# Patient Record
Sex: Male | Born: 1975 | Race: White | Hispanic: No | Smoking: Never smoker
Health system: Southern US, Community
[De-identification: ages and names within clinical notes are randomized; demographics above are authoritative.]

---

## 2020-09-29 ENCOUNTER — Emergency Department (HOSPITAL_BASED_OUTPATIENT_CLINIC_OR_DEPARTMENT_OTHER)
Admission: EM | Admit: 2020-09-29 | Discharge: 2020-09-29 | Disposition: A | Discharge status: Home / Self Care | Payer: No Typology Code available for payment source | Attending: Emergency Medicine | Admitting: Emergency Medicine

## 2020-09-29 ENCOUNTER — Other Ambulatory Visit: Payer: Self-pay

## 2020-09-29 ENCOUNTER — Emergency Department (HOSPITAL_BASED_OUTPATIENT_CLINIC_OR_DEPARTMENT_OTHER): Payer: No Typology Code available for payment source

## 2020-09-29 ENCOUNTER — Encounter (HOSPITAL_BASED_OUTPATIENT_CLINIC_OR_DEPARTMENT_OTHER): Payer: Self-pay | Admitting: Emergency Medicine

## 2020-09-29 DIAGNOSIS — H8110 Benign paroxysmal vertigo, unspecified ear: Secondary | ICD-10-CM

## 2020-09-29 DIAGNOSIS — R221 Localized swelling, mass and lump, neck: Secondary | ICD-10-CM | POA: Insufficient documentation

## 2020-09-29 DIAGNOSIS — R609 Edema, unspecified: Secondary | ICD-10-CM

## 2020-09-29 DIAGNOSIS — R599 Enlarged lymph nodes, unspecified: Secondary | ICD-10-CM

## 2020-09-29 LAB — BASIC METABOLIC PANEL
Anion gap: 8 (ref 5–15)
BUN: 11 mg/dL (ref 6–20)
CO2: 28 mmol/L (ref 22–32)
Calcium: 9.5 mg/dL (ref 8.9–10.3)
Chloride: 103 mmol/L (ref 98–111)
Creatinine, Ser: 0.75 mg/dL (ref 0.61–1.24)
GFR, Estimated: >60 mL/min (ref >60)
Glucose, Bld: 108 mg/dL — ABNORMAL HIGH (ref 70–99)
Potassium: 3.9 mmol/L (ref 3.5–5.1)
Sodium: 139 mmol/L (ref 135–145)

## 2020-09-29 LAB — CBC WITH DIFFERENTIAL/PLATELET
Abs Immature Granulocytes: 0.01 10*3/uL (ref 0.00–0.07)
Basophils Absolute: 0 10*3/uL (ref 0.0–0.1)
Basophils Relative: 1 %
Eosinophils Absolute: 0 10*3/uL (ref 0.0–0.5)
Eosinophils Relative: 1 %
HCT: 44.2 % (ref 39.0–52.0)
Hemoglobin: 15 g/dL (ref 13.0–17.0)
Immature Granulocytes: 0 %
Lymphocytes Relative: 38 %
Lymphs Abs: 1.9 10*3/uL (ref 0.7–4.0)
MCH: 29.9 pg (ref 26.0–34.0)
MCHC: 33.9 g/dL (ref 30.0–36.0)
MCV: 88.2 fL (ref 80.0–100.0)
Monocytes Absolute: 0.4 10*3/uL (ref 0.1–1.0)
Monocytes Relative: 9 %
Neutro Abs: 2.6 10*3/uL (ref 1.7–7.7)
Neutrophils Relative %: 51 %
Platelets: 214 10*3/uL (ref 150–400)
RBC: 5.01 MIL/uL (ref 4.22–5.81)
RDW: 13.4 % (ref 11.5–15.5)
WBC: 5 10*3/uL (ref 4.0–10.5)
nRBC: 0 % (ref 0.0–0.2)

## 2020-09-29 MED ORDER — MECLIZINE HCL 25 MG PO TABS: 25.0000 mg | ORAL_TABLET | Freq: Three times a day (TID) | ORAL | 0 refills | Status: AC | PRN

## 2020-09-29 NOTE — ED Provider Notes (Signed)
[facility] EMERGENCY DEPT Provider Note   CSN: [id number] Arrival date & time: 09/29/20  1121     History Chief Complaint  Patient presents with  . Dizziness    Daniel Horton is a 45 y.o. male.  The history is provided by the patient and the spouse.  Dizziness  Daniel Horton is a 45 y.o. male who presents to the Emergency Department complaining of dizziness. He presents for evaluation of dizziness/vertigo that started on Saturday morning while sleeping.  He wound turn his head or turn over in bed and he would experience episodes of vertigo. These would self extinguish. Since that time he has experienced persistent symptoms and at times they happened while he is awake and walking two. At times he has an unsteady gait. He also noticed a lump on the right side of his neck near his carotid about six weeks ago. He did see his PCP for this and they are watching it. He reports no change in size or swelling. No additional symptoms no headache, chest pain, abdominal pain, nausea, vomiting, diarrhea. He has no known medical problems and takes no medications.    History reviewed. No pertinent past medical history.  There are no problems to display for this patient.   History reviewed. No pertinent surgical history.     No family history on file.  Social History   Tobacco Use  . Smoking status: Never Smoker  Vaping Use  . Vaping Use: Never used  Substance Use Topics  . Alcohol use: Yes  . Drug use: Not Currently    Home Medications Prior to Admission medications   Medication Sig Start Date End Date Taking? Authorizing Provider  meclizine (ANTIVERT) 25 MG tablet Take 1 tablet (25 mg total) by mouth 3 (three) times daily as needed for dizziness. 09/29/20  Yes Tilden Fossa, MD    Allergies    Patient has no known allergies.  Review of Systems   Review of Systems  Neurological: Positive for dizziness.  All other systems reviewed and are negative.   Physical  Exam Updated Vital Signs BP 122/87 (BP Location: Right Arm)   Pulse 63   Temp 98 F (36.7 C) (Oral)   Resp 18   Ht 5\' 10"  (1.778 m)   Wt 79.8 kg   SpO2 99%   BMI 25.25 kg/m   Physical Exam Vitals and nursing note reviewed.  Constitutional:      Appearance: He is well-developed.  HENT:     Head: Normocephalic and atraumatic.     Right Ear: Tympanic membrane normal.     Left Ear: Tympanic membrane normal.  Neck:     Comments: There is a mobile swelling to the right superior lateral neck over the carotid that is nontender to palpation. No overlying erythema. Cardiovascular:     Rate and Rhythm: Normal rate and regular rhythm.     Heart sounds: No murmur heard.   Pulmonary:     Effort: Pulmonary effort is normal. No respiratory distress.     Breath sounds: Normal breath sounds.  Abdominal:     Palpations: Abdomen is soft.     Tenderness: There is no abdominal tenderness. There is no guarding or rebound.  Musculoskeletal:        General: No tenderness.     Cervical back: Neck supple.  Skin:    General: Skin is warm and dry.  Neurological:     Mental Status: He is alert and oriented to person, place, and time.  Comments: No asymmetry of facial movements. Pupils equal round and reactive. Five out of five strength in all four extremities with sensation light touch intact in all four extremities. No ataxia on finger to nose bilaterally.  Psychiatric:        Behavior: Behavior normal.     ED Results / Procedures / Treatments   Labs (all labs ordered are listed, but only abnormal results are displayed) Labs Reviewed  BASIC METABOLIC PANEL - Abnormal; Notable for the following components:      Result Value   Glucose, Bld 108 (*)    All other components within normal limits  CBC WITH DIFFERENTIAL/PLATELET    EKG EKG Interpretation  Date/Time:  Monday Sep 29 2020 15:26:17 EDT Ventricular Rate:  73 PR Interval:  151 QRS Duration: 96 QT Interval:  392 QTC  Calculation: 432 R Axis:   59 Text Interpretation: Sinus rhythm Left atrial enlargement Confirmed by Tilden Fossa [phone]) on 09/29/2020 3:29:46 PM   Radiology CT Head Wo Contrast  Result Date: 09/29/2020 CLINICAL DATA:  Dizziness and loss of balance for 3 days. EXAM: CT HEAD WITHOUT CONTRAST TECHNIQUE: Contiguous axial images were obtained from the base of the skull through the vertex without intravenous contrast. COMPARISON:  None. FINDINGS: Brain: The ventricles are normal in size and configuration. No extra-axial fluid collections are identified. The gray-white differentiation is maintained. No CT findings for acute hemispheric infarction or intracranial hemorrhage. No mass lesions. The brainstem and cerebellum are normal. Vascular: No hyperdense vessels or obvious aneurysm. Skull: No acute skull fracture.  No bone lesion. Sinuses/Orbits: The paranasal sinuses and mastoid air cells are clear except for a small amount of fluid in the left half the sphenoid sinus. The globes are intact. Other: No scalp lesions, laceration or hematoma. IMPRESSION: 1. No acute intracranial findings or mass lesions. 2. Small amount of fluid in the left half of the sphenoid sinus. Electronically Signed   By: Rudie Meyer M.D.   On: 09/29/2020 16:50   US SOFT TISSUE HEAD & NECK (NON-THYROID)  Result Date: 09/29/2020 CLINICAL DATA:  Palpable mass in the right neck EXAM: ULTRASOUND OF HEAD/NECK SOFT TISSUES TECHNIQUE: Ultrasound examination of the head and neck soft tissues was performed in the area of clinical concern. COMPARISON:  None. FINDINGS: Targeted sonography of the right neck is performed in the region of palpable concern. There is a benign appearing lymph node at the carotid bulb measuring 2.4 x 0.6 by 1.7 cm, there is preserved fatty hilus. Limited ultrasound of the contralateral left neck demonstrates a similar appearing benign lymph node measuring 2.4 x 0.6 x 1.2 cm. No cystic or solid mass is otherwise seen  IMPRESSION: Benign-appearing lymph node in the right neck with similar appearing lymph node in the contralateral left neck. Otherwise no cysts or solid masses are seen in the right neck. Electronically Signed   By: Jasmine Pang M.D.   On: 09/29/2020 17:40    Procedures Procedures   Medications Ordered in ED Medications - No data to display  ED Course  I have reviewed the triage vital signs and the nursing notes.  Pertinent labs & imaging results that were available during my care of the patient were reviewed by me and considered in my medical decision making (see chart for details).    MDM Rules/Calculators/A&P                         patient here for evaluation of intermittent dizziness  since Saturday. He also has increased swelling to the right lateral neck. On evaluation the department he is neurologically intact but symptoms do wax and wane. CT head is negative for acute abnormality. Ultrasound of the neck does demonstrate a lymph node that is benign appearing. No significant electrolyte abnormality or anemia on labs. Discussed with patient home care for vertigo, likely BPPV discussed outpatient follow-up and return precautions.  Final Clinical Impression(s) / ED Diagnoses Final diagnoses:  Swelling  Benign paroxysmal positional vertigo, unspecified laterality  Palpable lymph node    Rx / DC Orders ED Discharge Orders         Ordered    meclizine (ANTIVERT) 25 MG tablet  3 times daily PRN        09/29/20 Tobey Bride, MD 09/29/20 2341

## 2020-09-29 NOTE — Discharge Instructions (Signed)
The ultrasound of your neck showed a lymph node on the right side that is benign appearing. Please follow-up with your family doctor for recheck as needed.

## 2020-09-29 NOTE — ED Notes (Signed)
Pt reports dizziness and loss of balance since Friday night.  Pt reports feeling knot on right carotid that noticed 6 weeks ago, saw pcp and was told it was a swollen lymph node.  Pt concerned about location of knot and concern about the knot making him dizzy.

## 2020-09-29 NOTE — ED Triage Notes (Signed)
Dizziness x 2 days , lump on "carotide " x 6 weeks . Spinning feeling when laying down and turning. No further symptoms.

## 2020-09-29 NOTE — ED Notes (Signed)
Patient transported to Ultrasound 

## 2021-11-18 ENCOUNTER — Ambulatory Visit (INDEPENDENT_AMBULATORY_CARE_PROVIDER_SITE_OTHER): Payer: No Typology Code available for payment source | Admitting: Dermatology

## 2021-11-18 DIAGNOSIS — Z808 Family history of malignant neoplasm of other organs or systems: Secondary | ICD-10-CM | POA: Diagnosis not present

## 2021-11-18 DIAGNOSIS — L738 Other specified follicular disorders: Secondary | ICD-10-CM

## 2021-11-18 DIAGNOSIS — Z1283 Encounter for screening for malignant neoplasm of skin: Secondary | ICD-10-CM | POA: Diagnosis not present

## 2021-11-18 DIAGNOSIS — L918 Other hypertrophic disorders of the skin: Secondary | ICD-10-CM | POA: Diagnosis not present

## 2021-12-18 ENCOUNTER — Encounter: Payer: Self-pay | Admitting: Dermatology

## 2021-12-18 NOTE — Progress Notes (Signed)
   New Patient   Subjective  Daniel Horton is a 46 y.o. male who presents for the following: New Patient (Initial Visit) (Here for annual skin exam. No concerns. Family history of skin cancer. ).  Normal skin examination, no particular areas of concern Location:  Duration:  Quality:  Associated Signs/Symptoms: Modifying Factors:  Severity:  Timing: Context:    The following portions of the chart were reviewed this encounter and updated as appropriate:  Tobacco  Allergies  Meds  Problems  Med Hx  Surg Hx  Fam Hx      Objective  Well appearing patient in no apparent distress; mood and affect are within normal limits. Full body skin exam. No atypical moles or non mole skin cancer.  All pigmented lesions checked with dermoscopy.  Head - Anterior (Face) Several 2 mm flesh-colored papules with eccentric, compatible dermoscopy  Right Inguinal Area 2 mm flesh-colored pedunculated papule    A full examination was performed including scalp, head, eyes, ears, nose, lips, neck, chest, axillae, abdomen, back, buttocks, bilateral upper extremities, bilateral lower extremities, hands, feet, fingers, toes, fingernails, and toenails. All findings within normal limits unless otherwise noted below.   Assessment & Plan  Screening for malignant neoplasm of skin  Yearly skin exam.  Encouraged to self examine twice annually.  Continued ultraviolet protection.  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

## 2022-05-04 IMAGING — CT CT HEAD W/O CM
4 series · 17 of 47 positions shown, 19 images · non-contrast
Comparison: None.

CLINICAL DATA: Dizziness and loss of balance for 3 days.

EXAM:
CT HEAD WITHOUT CONTRAST
TECHNIQUE: Contiguous axial images were obtained from the base of the skull
through the vertex without intravenous contrast.

[Series 2: head wo · axial · 0.45mm/px · z∈[-71,+49]mm · 7 of 33 slices shown, 9 images]
[im 5/33  brain]
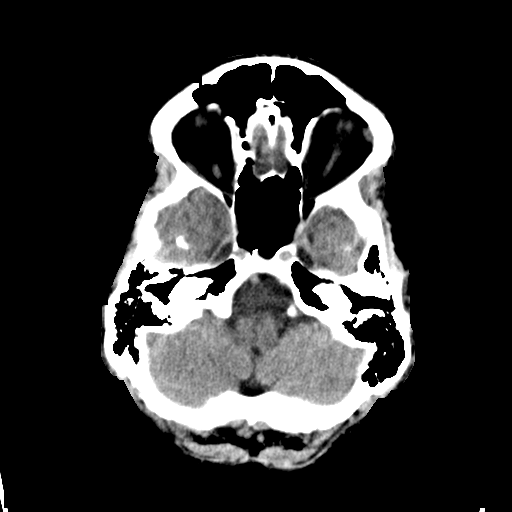
[im 5/33  bone]
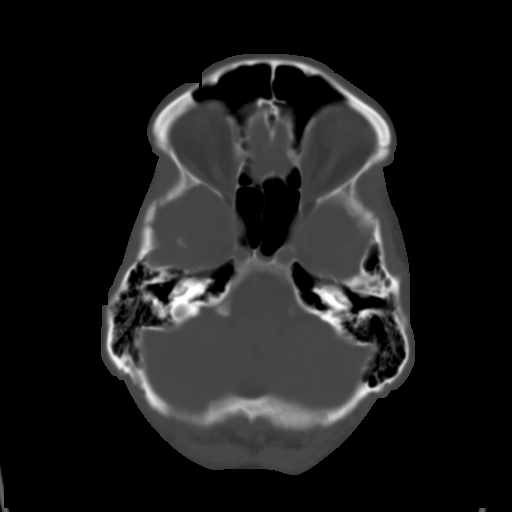
[im 9/33  brain]
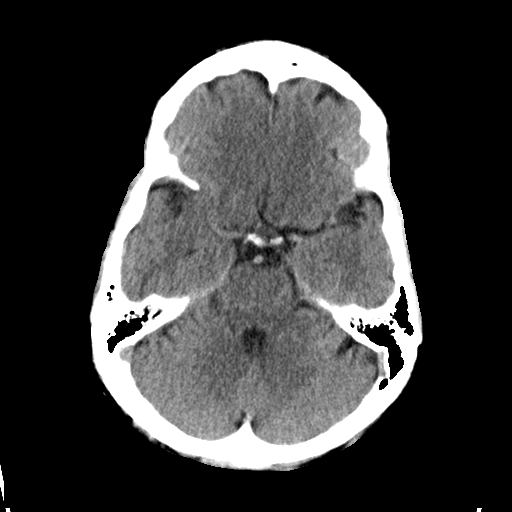
[im 13/33  brain]
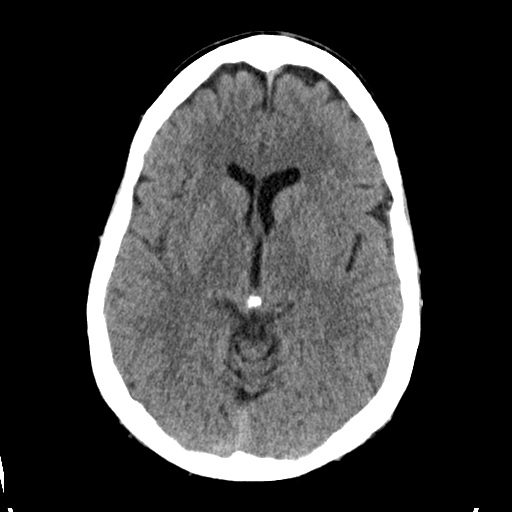
[im 17/33  brain]
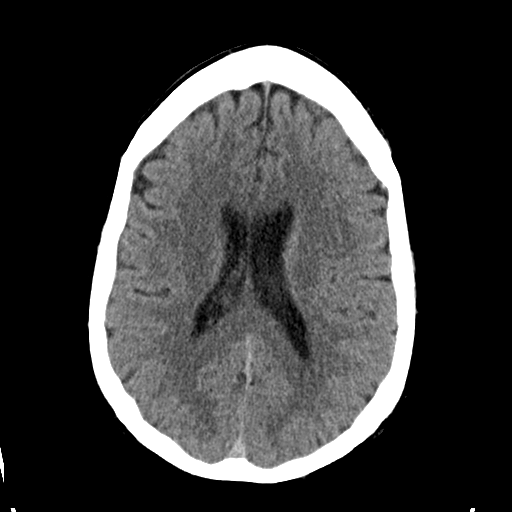
[im 21/33  brain]
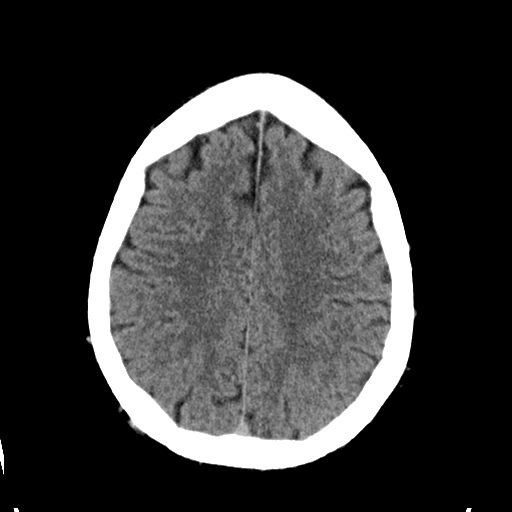
[im 21/33  bone]
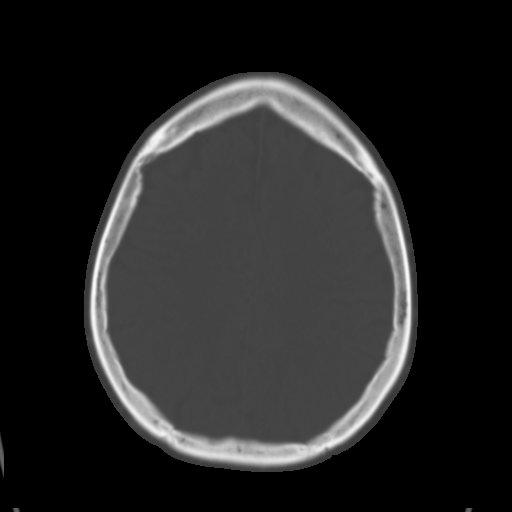
[im 25/33  brain]
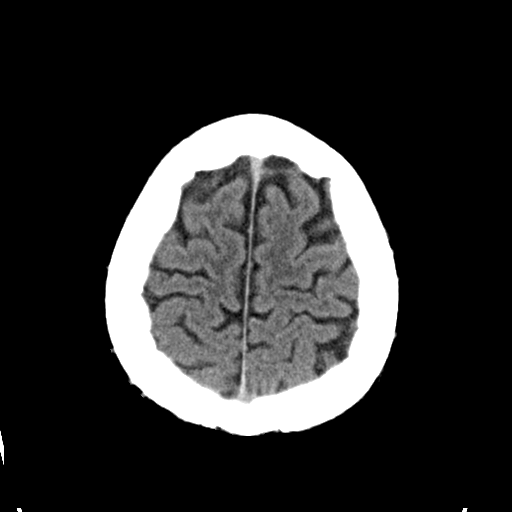
[im 29/33  brain]
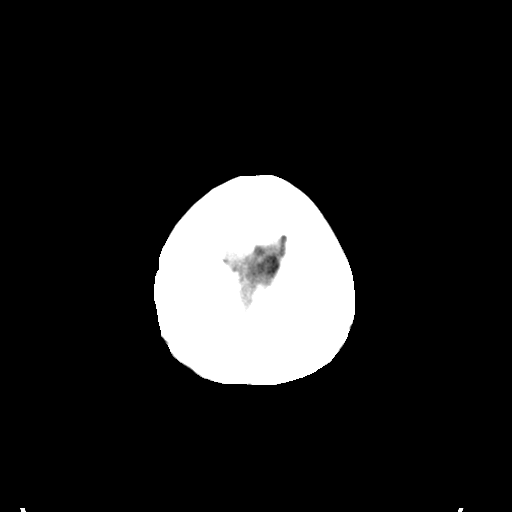

[Series 3: head bone · axial · 0.45mm/px · z∈[-75,-19]mm · 4 of 81 slices shown]
[im 9/81  bone]
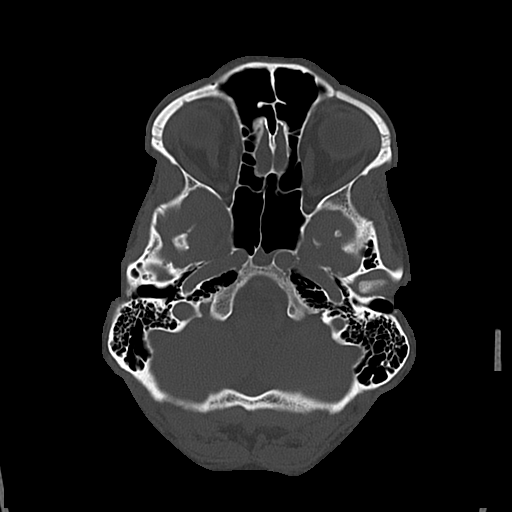
[im 17/81  bone]
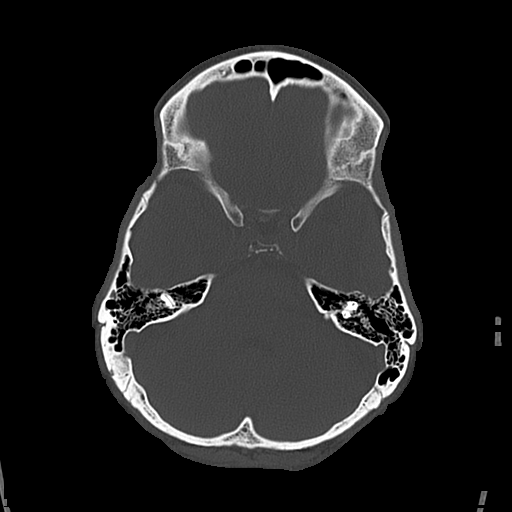
[im 25/81  bone]
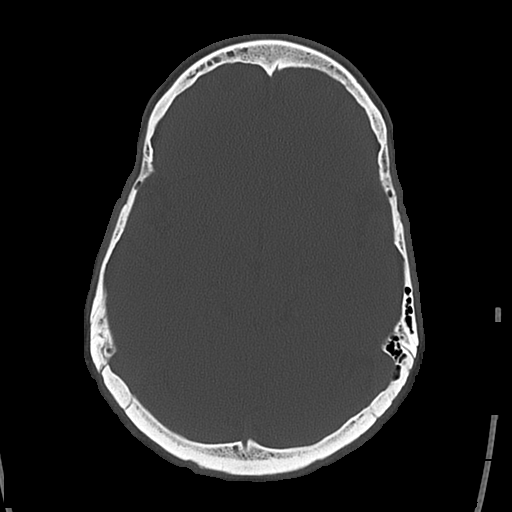
[im 37/81  bone]
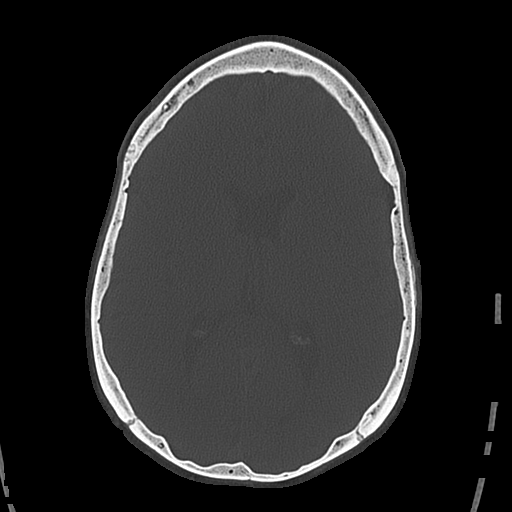

[Series 4: coronal soft · coronal · 0.33mm/px · 3 of 72 slices shown]
[im 24/72  brain]
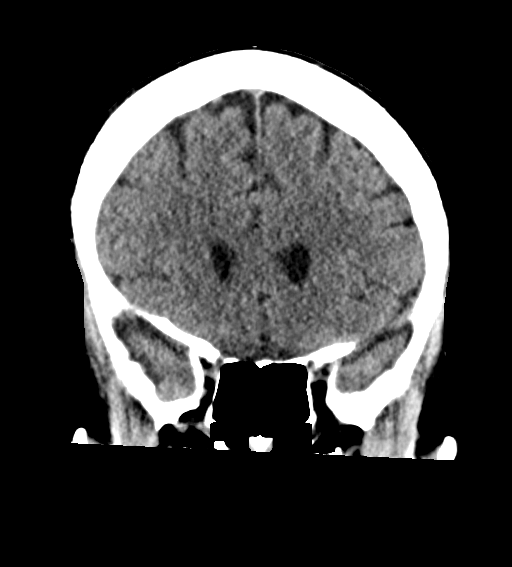
[im 32/72  brain]
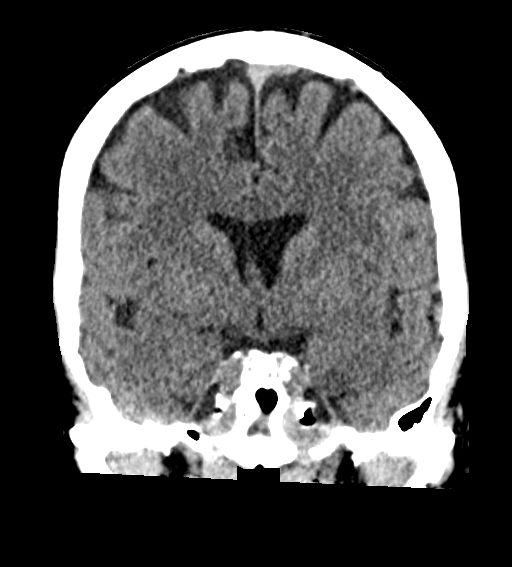
[im 40/72  brain]
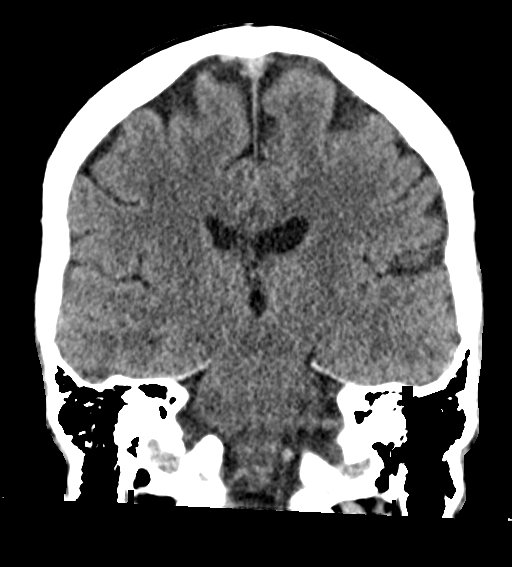

[Series 5: sagittal soft · sagittal · 0.37mm/px · 3 of 57 slices shown]
[im 19/57  brain]
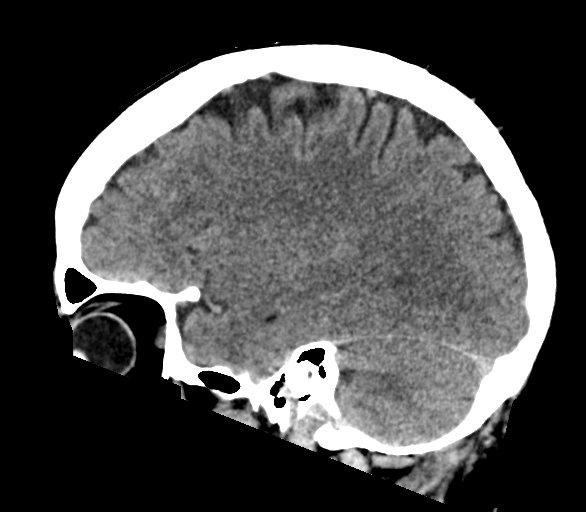
[im 29/57  brain]
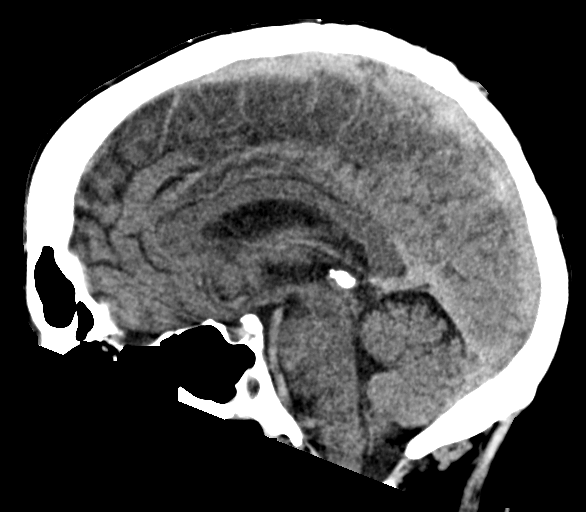
[im 38/57  brain]
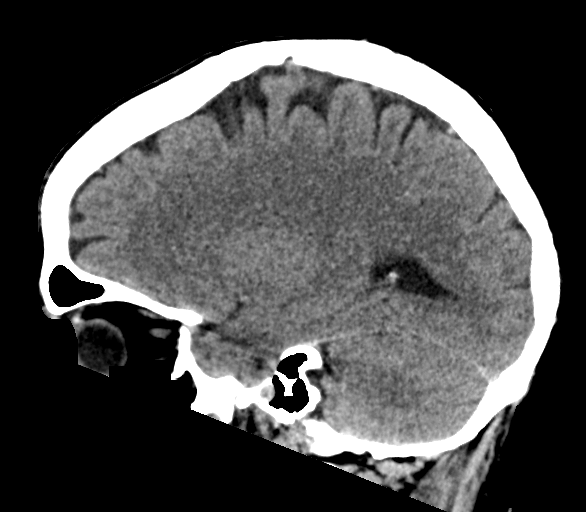

[17 of 47 positions shown; findings below may reference images not displayed]

FINDINGS: Brain: The ventricles are normal in size and configuration. No
extra-axial fluid collections are identified. The gray-white
differentiation is maintained. No CT findings for acute hemispheric
infarction or intracranial hemorrhage. No mass lesions. The
brainstem and cerebellum are normal.

Vascular: No hyperdense vessels or obvious aneurysm.

Skull: No acute skull fracture.  No bone lesion.

Sinuses/Orbits: The paranasal sinuses and mastoid air cells are
clear except for a small amount of fluid in the left half the
sphenoid sinus. The globes are intact.

Other: No scalp lesions, laceration or hematoma.
IMPRESSION: 1. No acute intracranial findings or mass lesions.
2. Small amount of fluid in the left half of the sphenoid sinus.

## 2022-05-04 IMAGING — US US SOFT TISSUE HEAD/NECK
1 series · 14 of 15 positions shown · non-contrast
Comparison: None.

CLINICAL DATA: Palpable mass in the right neck

EXAM:
ULTRASOUND OF HEAD/NECK SOFT TISSUES
TECHNIQUE: Ultrasound examination of the head and neck soft tissues was
performed in the area of clinical concern.

[Series 1: us soft tissue head & neck (non-thyroid) · 15 acquisitions, 14 frames shown]
[im 1/15]
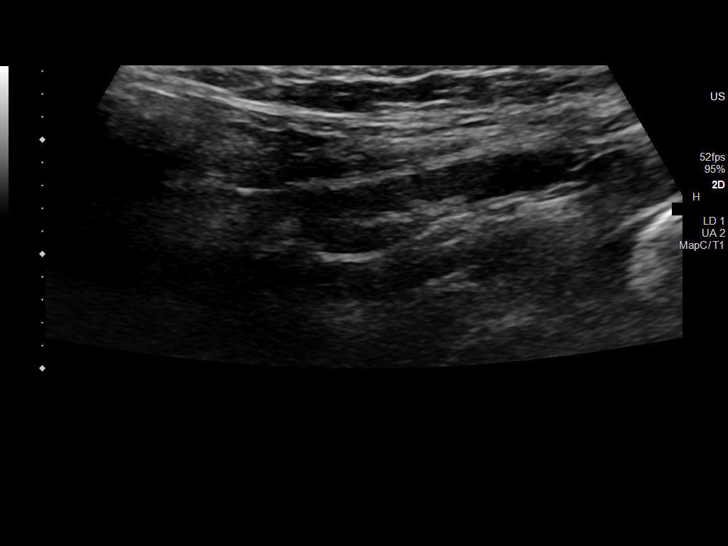
[im 2/15]
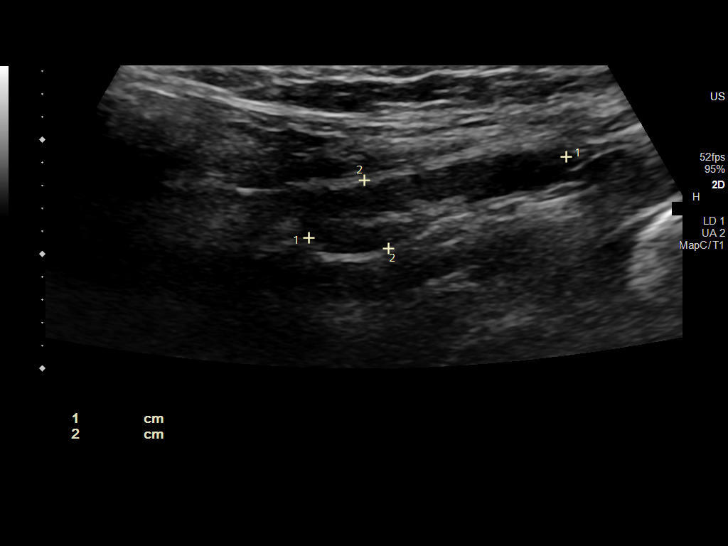
[im 3/15]
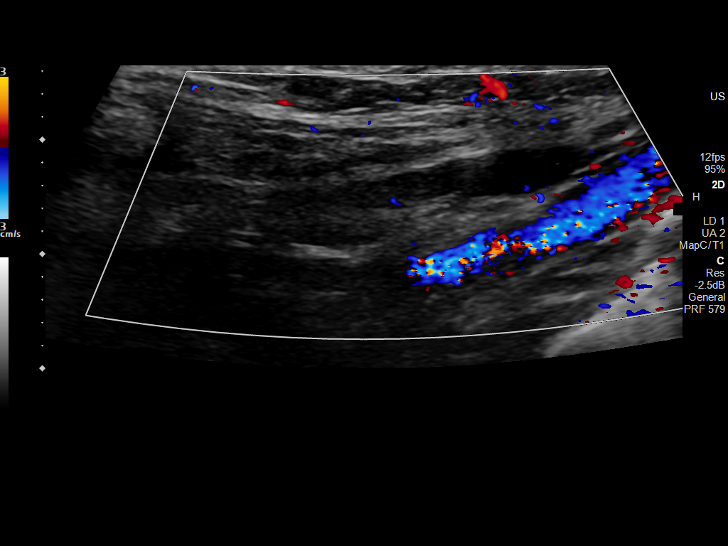
[im 4/15]
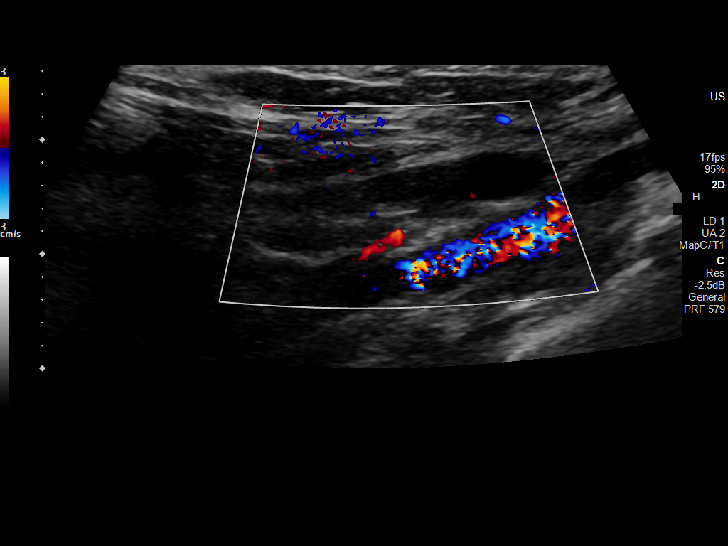
[im 5/15]
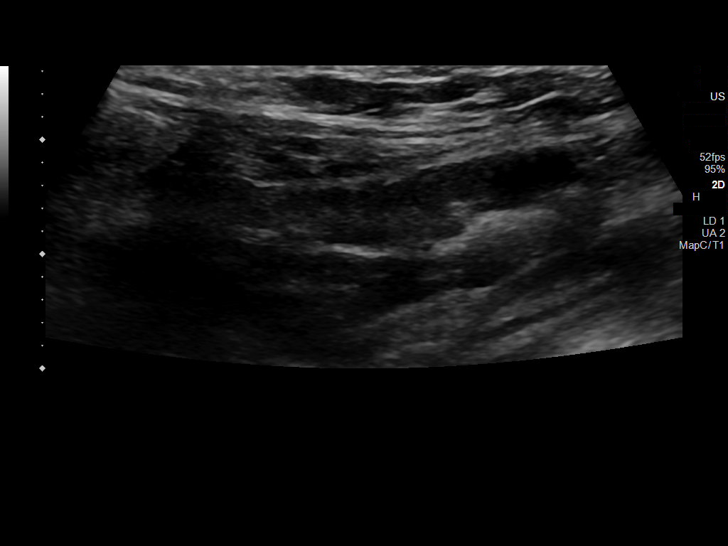
[im 6/15]
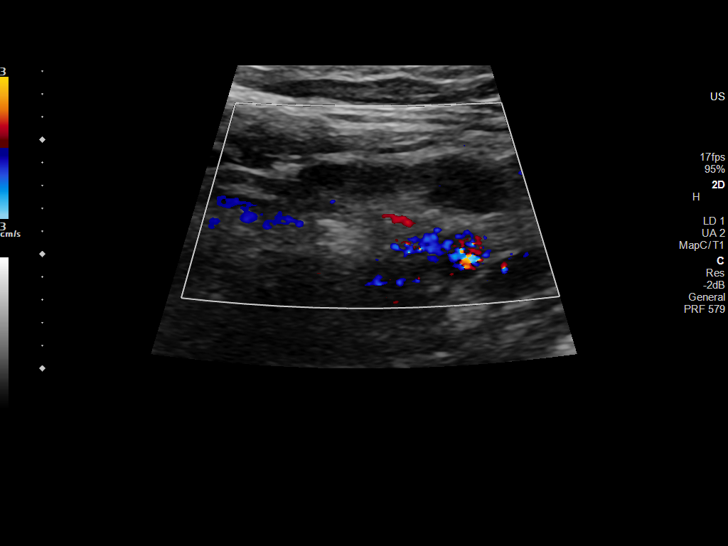
[im 7/15]
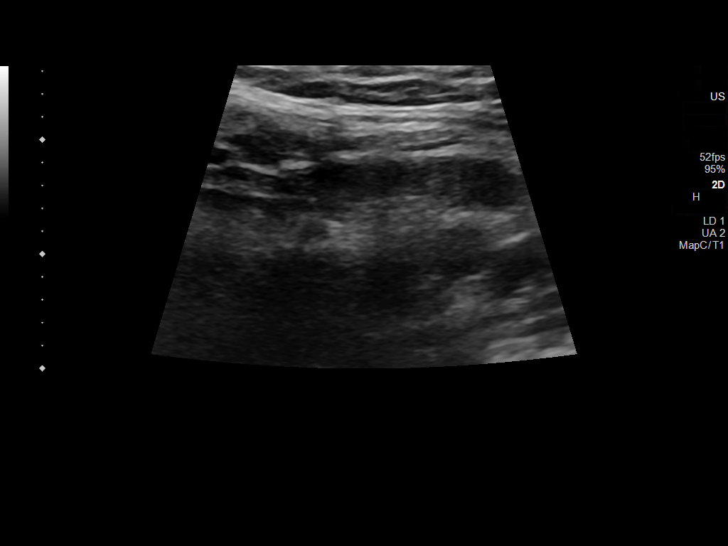
[im 9/15]
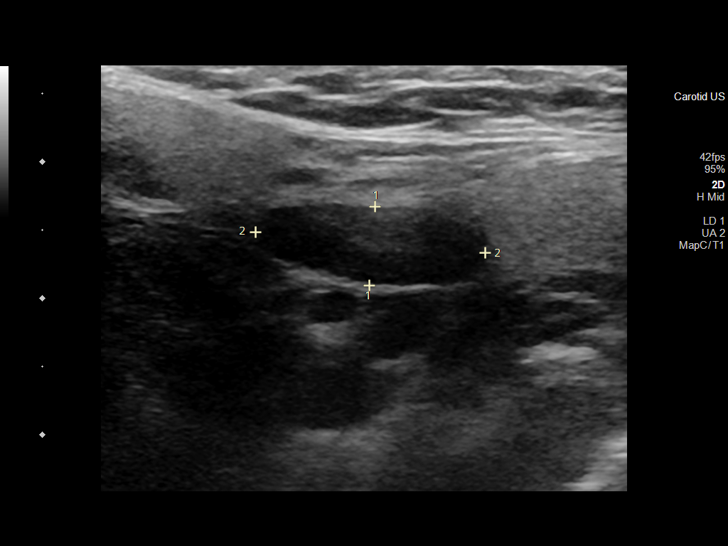
[im 10/15]
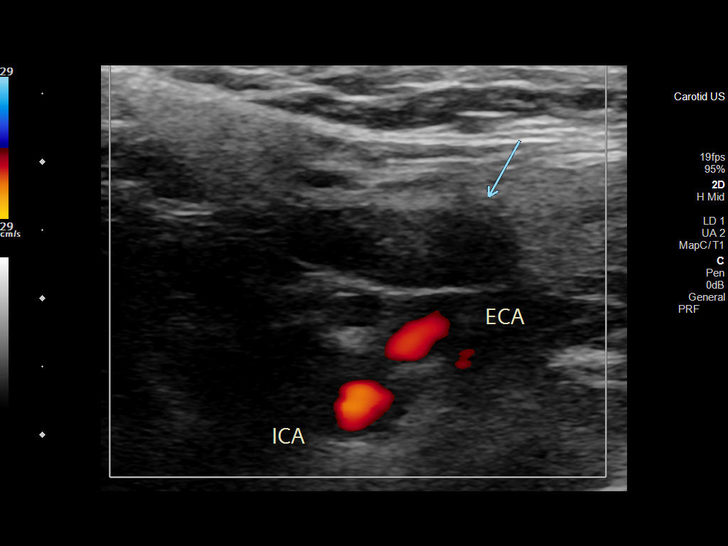
[im 11/15]
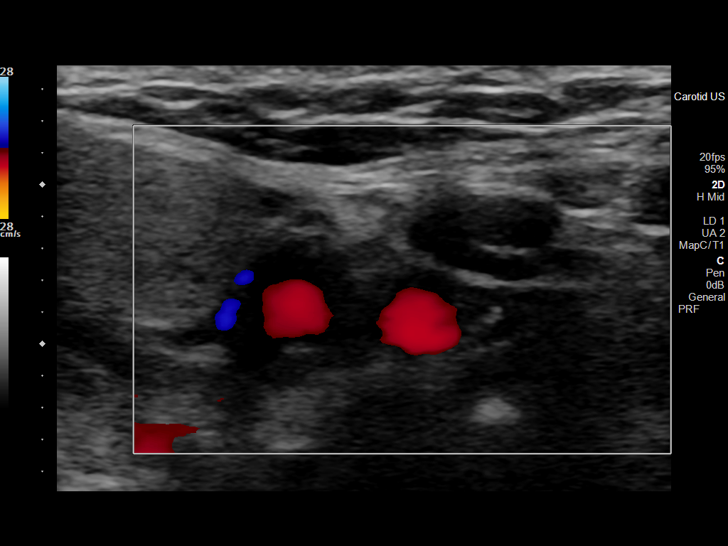
[im 12/15]
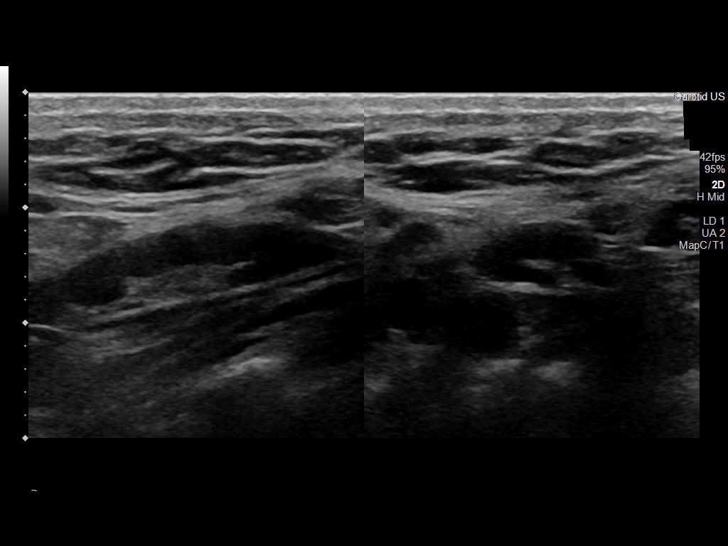
[im 13/15]
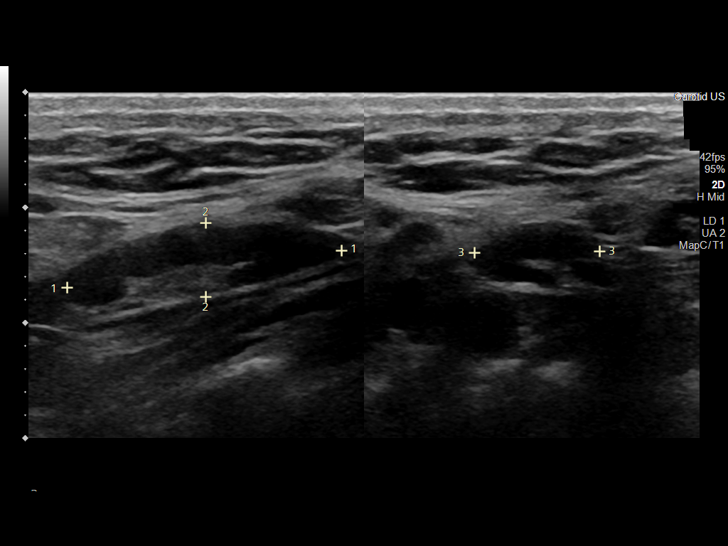
[im 14/15]
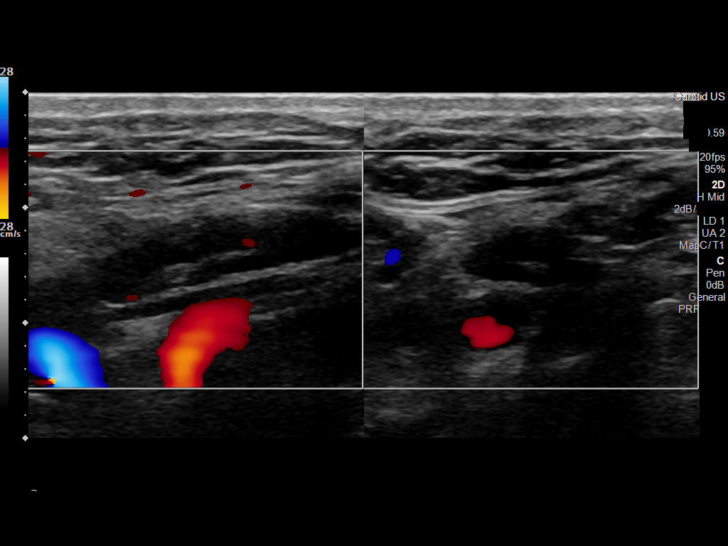
[im 15/15]
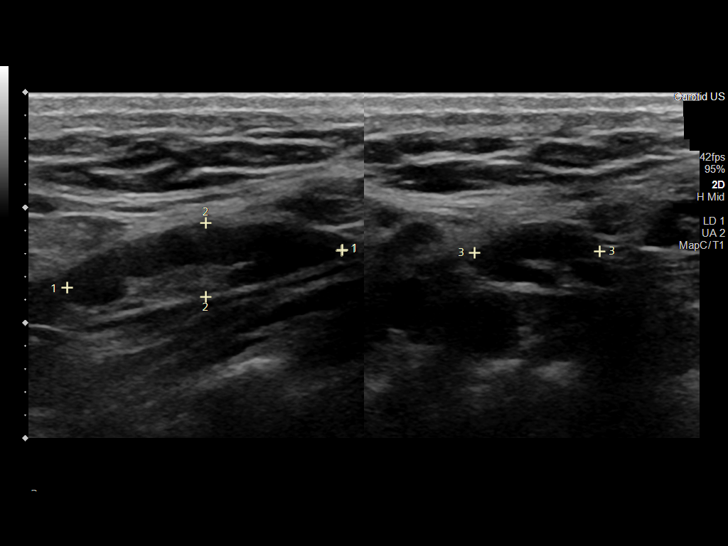

[14 of 15 positions shown; findings below may reference images not displayed]

FINDINGS: Targeted sonography of the right neck is performed in the region of
palpable concern. There is a benign appearing lymph node at the
carotid bulb measuring 2.4 x 0.6 by 1.7 cm, there is preserved fatty
hilus. Limited ultrasound of the contralateral left neck
demonstrates a similar appearing benign lymph node measuring 2.4 x
0.6 x 1.2 cm. No cystic or solid mass is otherwise seen
IMPRESSION: Benign-appearing lymph node in the right neck with similar appearing
lymph node in the contralateral left neck. Otherwise no cysts or
solid masses are seen in the right neck.
